# Patient Record
Sex: Male | Born: 1980 | Race: Black or African American | Hispanic: No | Marital: Single | Smoking: Current some day smoker
Health system: Southern US, Community
[De-identification: ages and names within clinical notes are randomized; demographics above are authoritative.]

## PROBLEM LIST (undated history)

## (undated) DIAGNOSIS — F419 Anxiety disorder, unspecified: Secondary | ICD-10-CM

## (undated) DIAGNOSIS — M542 Cervicalgia: Secondary | ICD-10-CM

## (undated) DIAGNOSIS — I10 Essential (primary) hypertension: Secondary | ICD-10-CM

## (undated) DIAGNOSIS — M545 Low back pain, unspecified: Secondary | ICD-10-CM

## (undated) DIAGNOSIS — F4310 Post-traumatic stress disorder, unspecified: Secondary | ICD-10-CM

## (undated) DIAGNOSIS — IMO0002 Reserved for concepts with insufficient information to code with codable children: Secondary | ICD-10-CM

## (undated) HISTORY — PX: FOOT SURGERY: SHX648

---

## 2008-09-11 ENCOUNTER — Emergency Department (HOSPITAL_COMMUNITY): Admission: EM | Admit: 2008-09-11 | Discharge: 2008-09-11 | Payer: Self-pay | Admitting: Emergency Medicine

## 2010-04-07 ENCOUNTER — Emergency Department (HOSPITAL_COMMUNITY): Admission: EM | Admit: 2010-04-07 | Discharge: 2010-04-07 | Payer: Self-pay | Admitting: Emergency Medicine

## 2010-04-15 ENCOUNTER — Emergency Department (HOSPITAL_COMMUNITY): Admission: EM | Admit: 2010-04-15 | Discharge: 2010-04-15 | Payer: Self-pay | Admitting: Emergency Medicine

## 2010-04-24 ENCOUNTER — Emergency Department (HOSPITAL_COMMUNITY): Admission: EM | Admit: 2010-04-24 | Discharge: 2010-04-24 | Payer: Self-pay | Admitting: Emergency Medicine

## 2010-04-25 ENCOUNTER — Emergency Department (HOSPITAL_COMMUNITY): Admission: EM | Admit: 2010-04-25 | Discharge: 2010-04-25 | Payer: Self-pay | Admitting: Emergency Medicine

## 2010-08-28 ENCOUNTER — Emergency Department (HOSPITAL_COMMUNITY): Admission: EM | Admit: 2010-08-28 | Discharge: 2010-08-28 | Payer: Self-pay | Admitting: Emergency Medicine

## 2010-09-01 ENCOUNTER — Ambulatory Visit (HOSPITAL_COMMUNITY): Payer: Self-pay | Admitting: Psychiatry

## 2010-09-09 ENCOUNTER — Ambulatory Visit (HOSPITAL_COMMUNITY): Payer: Self-pay | Admitting: Psychiatry

## 2010-09-11 ENCOUNTER — Emergency Department (HOSPITAL_COMMUNITY): Admission: EM | Admit: 2010-09-11 | Discharge: 2010-09-11 | Payer: Self-pay | Admitting: Emergency Medicine

## 2010-09-15 ENCOUNTER — Emergency Department (HOSPITAL_COMMUNITY): Admission: EM | Admit: 2010-09-15 | Discharge: 2010-09-15 | Payer: Self-pay | Admitting: Emergency Medicine

## 2010-09-25 ENCOUNTER — Ambulatory Visit (HOSPITAL_COMMUNITY): Payer: Self-pay | Admitting: Psychiatry

## 2010-09-30 ENCOUNTER — Emergency Department (HOSPITAL_COMMUNITY): Admission: EM | Admit: 2010-09-30 | Discharge: 2010-09-30 | Payer: Self-pay | Admitting: Emergency Medicine

## 2010-10-03 ENCOUNTER — Ambulatory Visit (HOSPITAL_COMMUNITY): Payer: Self-pay | Admitting: Psychiatry

## 2010-10-07 ENCOUNTER — Ambulatory Visit (HOSPITAL_COMMUNITY): Payer: Self-pay | Admitting: Psychiatry

## 2010-10-21 ENCOUNTER — Ambulatory Visit (HOSPITAL_COMMUNITY): Payer: Self-pay | Admitting: Psychiatry

## 2010-11-01 ENCOUNTER — Emergency Department (HOSPITAL_COMMUNITY)
Admission: EM | Admit: 2010-11-01 | Discharge: 2010-11-01 | Payer: Self-pay | Source: Home / Self Care | Admitting: Emergency Medicine

## 2010-11-07 ENCOUNTER — Ambulatory Visit (HOSPITAL_COMMUNITY): Payer: Self-pay | Admitting: Psychiatry

## 2010-11-10 ENCOUNTER — Ambulatory Visit (HOSPITAL_COMMUNITY): Payer: Self-pay | Admitting: Psychiatry

## 2010-12-15 ENCOUNTER — Encounter
Admission: RE | Admit: 2010-12-15 | Discharge: 2010-12-30 | Payer: Self-pay | Source: Home / Self Care | Attending: Podiatry | Admitting: Podiatry

## 2010-12-16 ENCOUNTER — Ambulatory Visit (HOSPITAL_COMMUNITY): Admit: 2010-12-16 | Payer: Self-pay | Admitting: Psychiatry

## 2010-12-16 IMAGING — CR DG CHEST 2V
2 series · 2 of 2 positions shown · non-contrast
Comparison: None.

CLINICAL DATA: Short of breath/bronchitis

CHEST - 2 VIEW

[w chest pa]
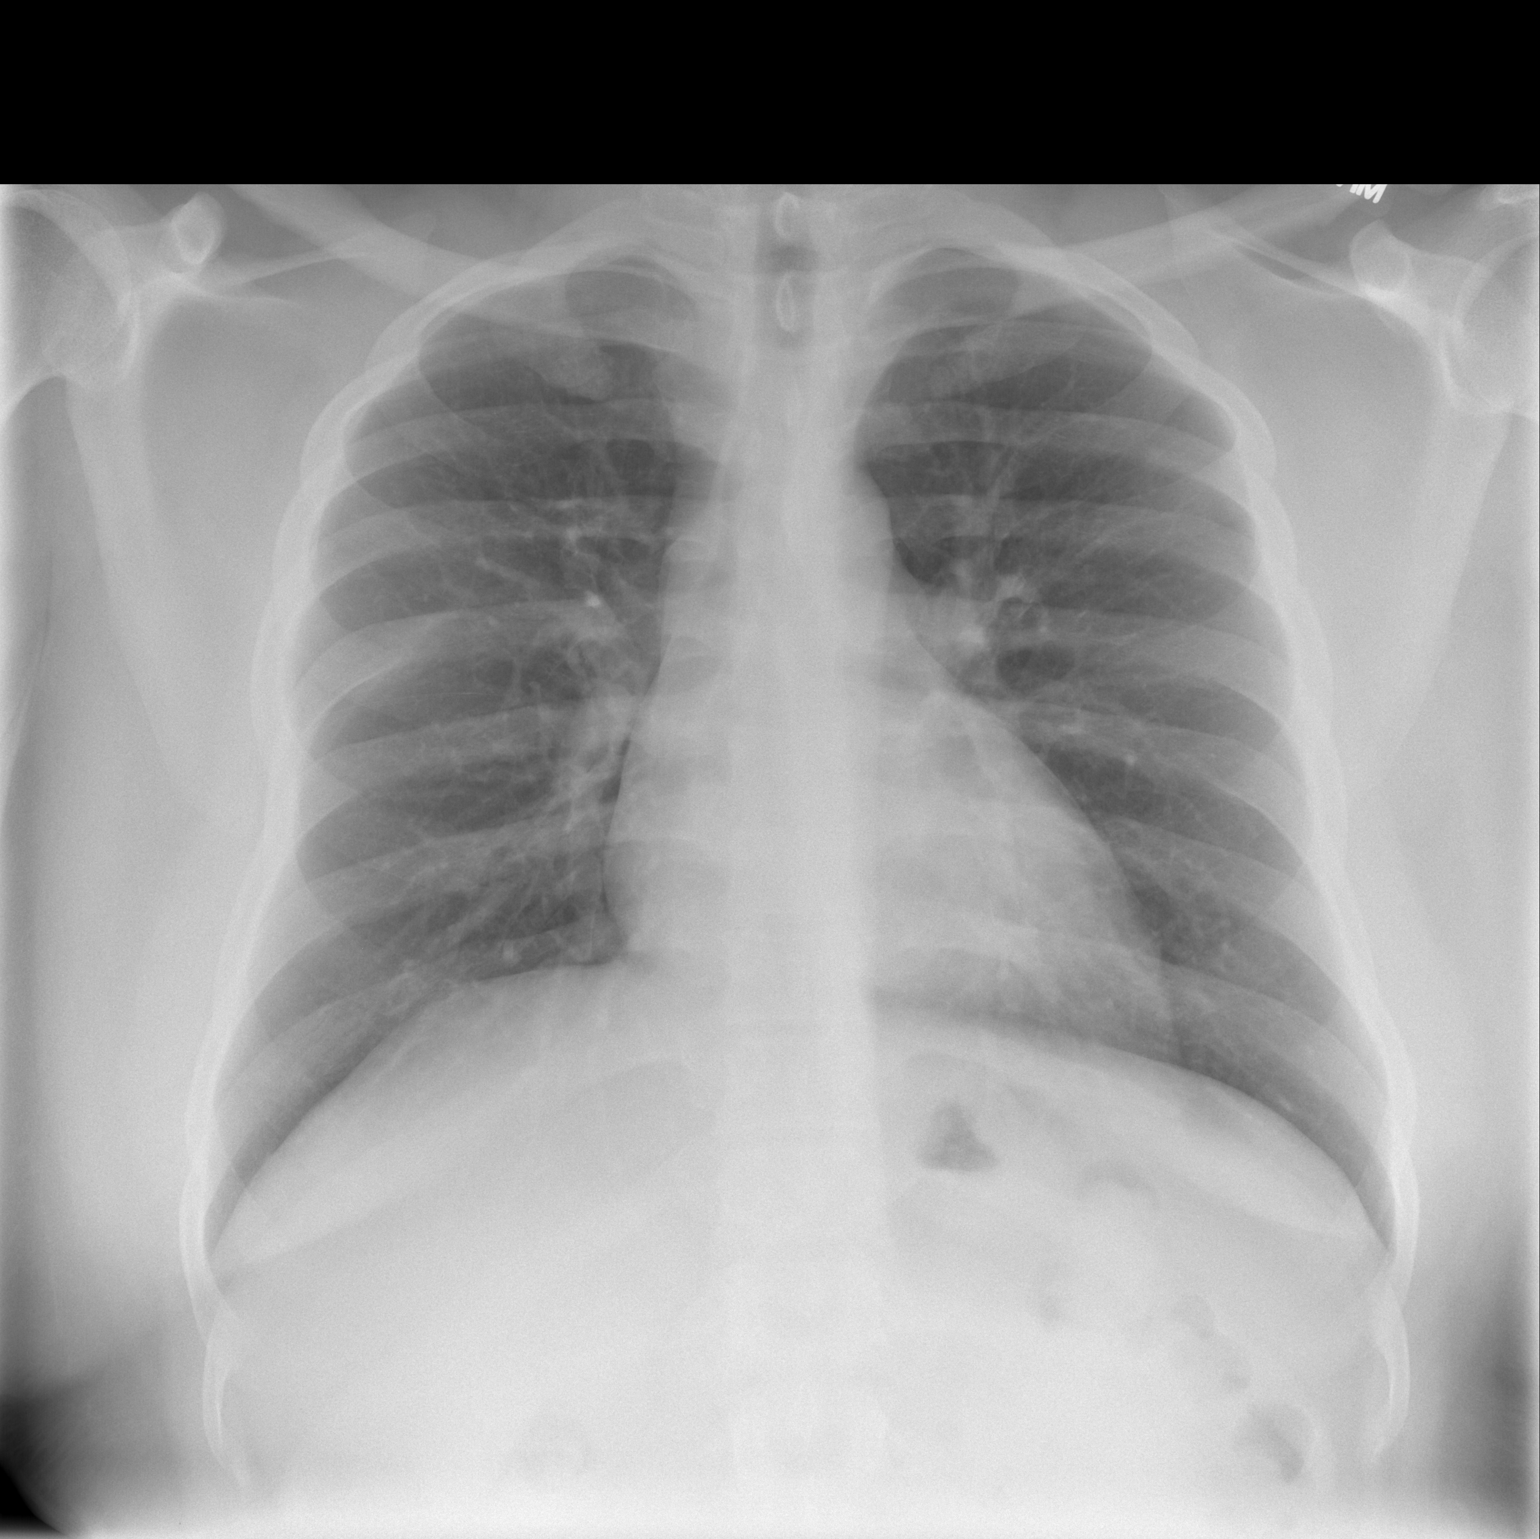

[w chest lat]
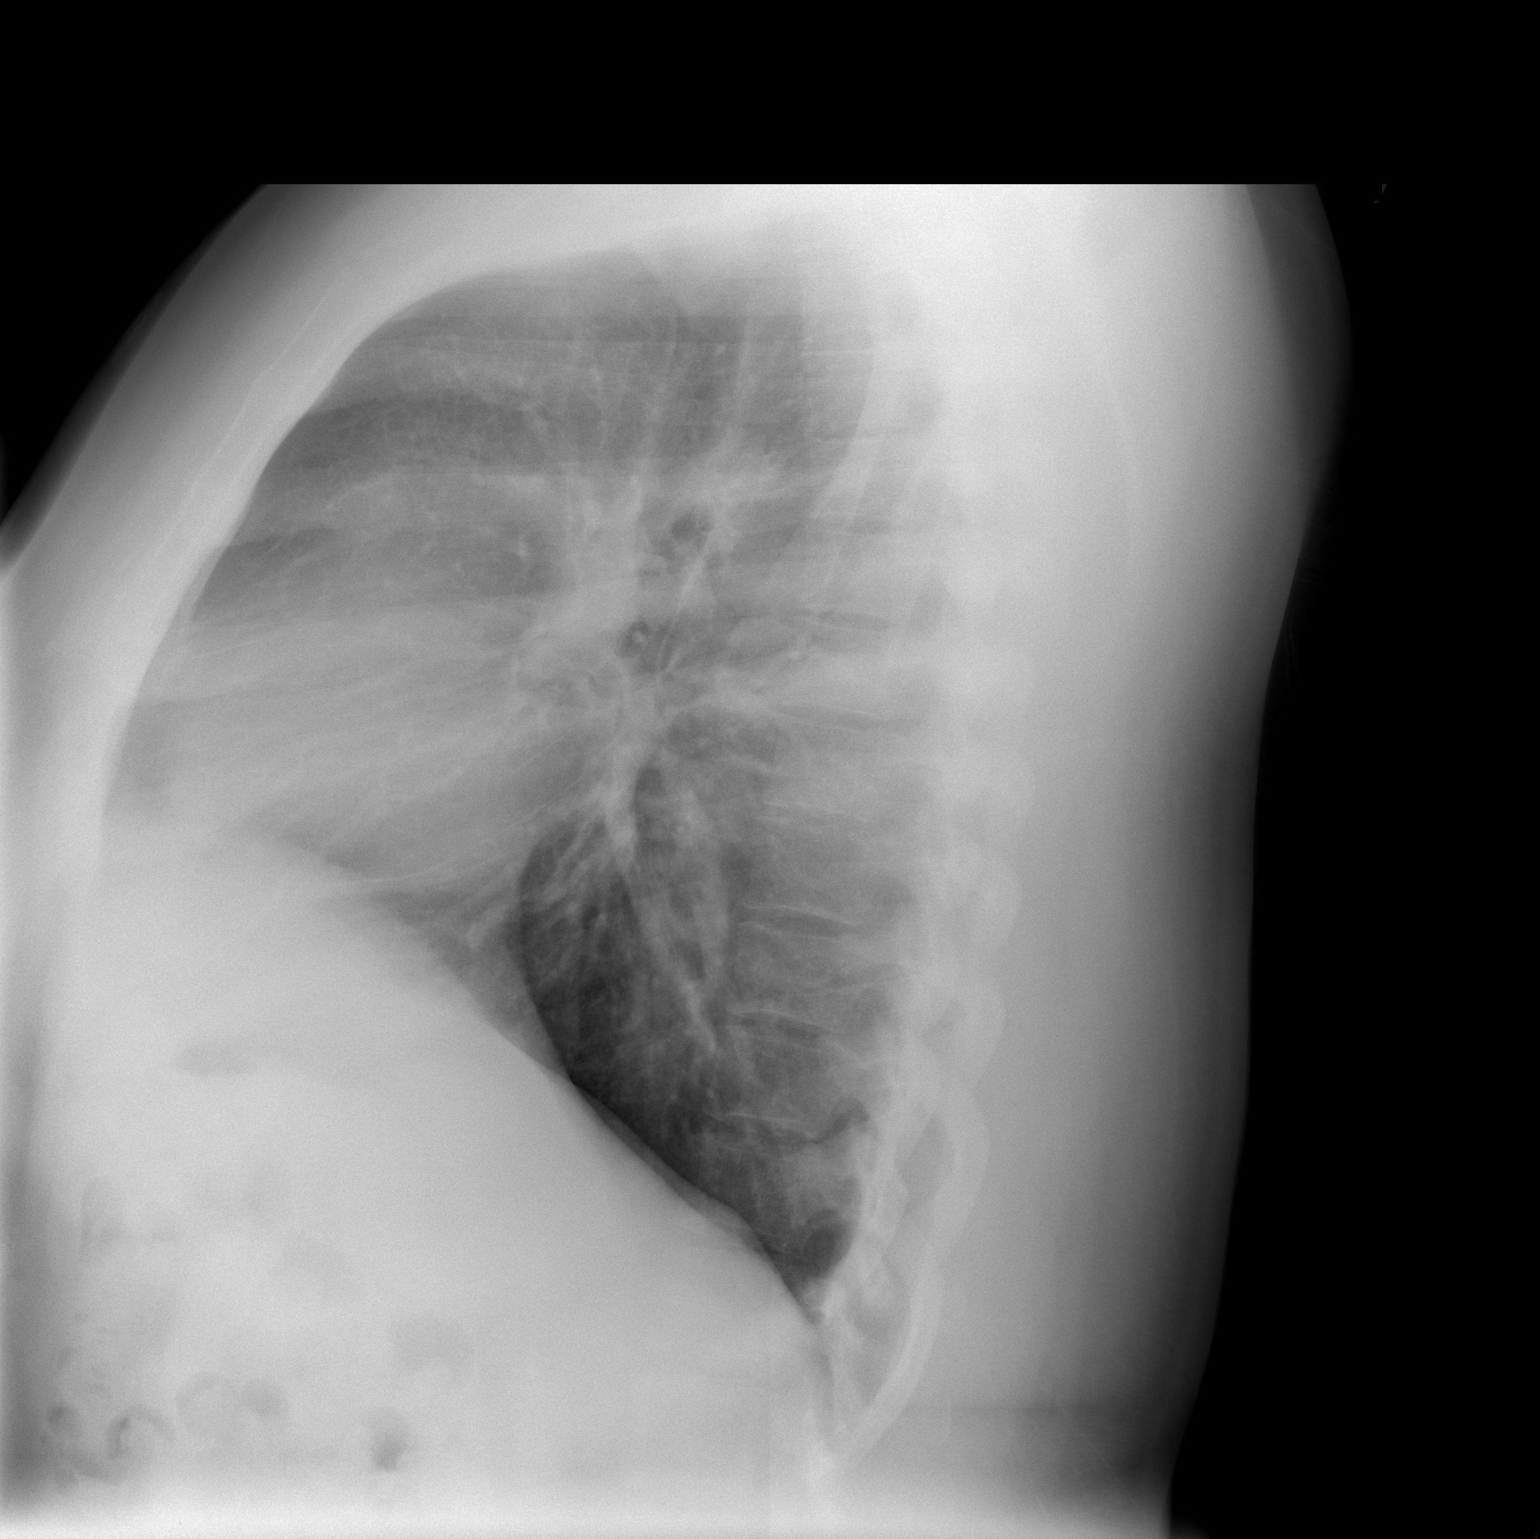

[2 of 2 positions shown; findings below may reference images not displayed]

FINDINGS: Heart and mediastinal contours normal.  Lungs clear.  No
pleural fluid.  Osseous structures and soft tissues unremarkable.
IMPRESSION: No acute or significant findings.

## 2010-12-23 ENCOUNTER — Ambulatory Visit (HOSPITAL_COMMUNITY)
Admission: RE | Admit: 2010-12-23 | Discharge: 2010-12-23 | Payer: Self-pay | Source: Home / Self Care | Attending: Psychiatry | Admitting: Psychiatry

## 2010-12-28 ENCOUNTER — Emergency Department (HOSPITAL_COMMUNITY)
Admission: EM | Admit: 2010-12-28 | Discharge: 2010-12-28 | Payer: Self-pay | Source: Home / Self Care | Admitting: Emergency Medicine

## 2010-12-30 ENCOUNTER — Ambulatory Visit (HOSPITAL_COMMUNITY): Admit: 2010-12-30 | Payer: Self-pay | Admitting: Psychiatry

## 2010-12-31 ENCOUNTER — Ambulatory Visit: Payer: Self-pay | Admitting: Physical Therapy

## 2011-01-02 ENCOUNTER — Encounter: Payer: Self-pay | Admitting: Physical Therapy

## 2011-02-16 LAB — URINALYSIS, ROUTINE W REFLEX MICROSCOPIC
Glucose, UA: NEGATIVE mg/dL
Leukocytes, UA: NEGATIVE
Nitrite: NEGATIVE
Protein, ur: 30 mg/dL — AB
pH: 6.5 (ref 5.0–8.0)

## 2011-02-16 LAB — BASIC METABOLIC PANEL
BUN: 10 mg/dL (ref 6–23)
CO2: 23 mEq/L (ref 19–32)
Calcium: 8.1 mg/dL — ABNORMAL LOW (ref 8.4–10.5)
Creatinine, Ser: 1.14 mg/dL (ref 0.4–1.5)
GFR calc Af Amer: >60 mL/min (ref >60)

## 2011-02-16 LAB — CBC
HCT: 45 % (ref 39.0–52.0)
Hemoglobin: 15 g/dL (ref 13.0–17.0)
MCHC: 33.4 g/dL (ref 30.0–36.0)
MCHC: 34 g/dL (ref 30.0–36.0)
MCV: 90.1 fL (ref 78.0–100.0)
MCV: 91.2 fL (ref 78.0–100.0)
Platelets: 254 10*3/uL (ref 150–400)
RBC: 4.47 MIL/uL (ref 4.22–5.81)
RDW: 12.2 % (ref 11.5–15.5)
RDW: 12.4 % (ref 11.5–15.5)

## 2011-02-16 LAB — DIFFERENTIAL
Basophils Absolute: 0 10*3/uL (ref 0.0–0.1)
Basophils Relative: 0 % (ref 0–1)
Basophils Relative: 0 % (ref 0–1)
Eosinophils Absolute: 0 10*3/uL (ref 0.0–0.7)
Lymphs Abs: 1.8 10*3/uL (ref 0.7–4.0)
Monocytes Relative: 10 % (ref 3–12)
Monocytes Relative: 15 % — ABNORMAL HIGH (ref 3–12)
Neutro Abs: 10.6 10*3/uL — ABNORMAL HIGH (ref 1.7–7.7)
Neutro Abs: 13.7 10*3/uL — ABNORMAL HIGH (ref 1.7–7.7)
Neutrophils Relative %: 75 % (ref 43–77)
Neutrophils Relative %: 80 % — ABNORMAL HIGH (ref 43–77)

## 2011-02-16 LAB — COMPREHENSIVE METABOLIC PANEL
BUN: 14 mg/dL (ref 6–23)
Calcium: 8.5 mg/dL (ref 8.4–10.5)
Creatinine, Ser: 1.25 mg/dL (ref 0.4–1.5)
Glucose, Bld: 123 mg/dL — ABNORMAL HIGH (ref 70–99)
Total Protein: 7.1 g/dL (ref 6.0–8.3)

## 2011-02-16 LAB — URINE MICROSCOPIC-ADD ON

## 2011-02-16 LAB — LIPASE, BLOOD: Lipase: 20 U/L (ref 11–59)

## 2011-09-07 ENCOUNTER — Emergency Department (HOSPITAL_COMMUNITY)
Admission: EM | Admit: 2011-09-07 | Discharge: 2011-09-07 | Disposition: A | Discharge status: Home / Self Care | Payer: Medicaid Other | Attending: Emergency Medicine | Admitting: Emergency Medicine

## 2011-09-07 DIAGNOSIS — I10 Essential (primary) hypertension: Secondary | ICD-10-CM | POA: Insufficient documentation

## 2011-09-07 DIAGNOSIS — M542 Cervicalgia: Secondary | ICD-10-CM | POA: Insufficient documentation

## 2011-09-07 DIAGNOSIS — M549 Dorsalgia, unspecified: Secondary | ICD-10-CM | POA: Insufficient documentation

## 2011-09-07 DIAGNOSIS — M79609 Pain in unspecified limb: Secondary | ICD-10-CM | POA: Insufficient documentation

## 2011-09-07 DIAGNOSIS — IMO0002 Reserved for concepts with insufficient information to code with codable children: Secondary | ICD-10-CM | POA: Insufficient documentation

## 2011-09-07 DIAGNOSIS — R209 Unspecified disturbances of skin sensation: Secondary | ICD-10-CM | POA: Insufficient documentation

## 2011-09-08 ENCOUNTER — Ambulatory Visit (HOSPITAL_BASED_OUTPATIENT_CLINIC_OR_DEPARTMENT_OTHER)
Admission: RE | Admit: 2011-09-08 | Payer: Medicaid Other | Source: Ambulatory Visit | Admitting: Oral and Maxillofacial Surgery

## 2011-09-25 ENCOUNTER — Emergency Department (HOSPITAL_COMMUNITY)
Admission: EM | Admit: 2011-09-25 | Discharge: 2011-09-25 | Disposition: A | Discharge status: Home / Self Care | Payer: Medicaid Other | Attending: Emergency Medicine | Admitting: Emergency Medicine

## 2011-09-25 DIAGNOSIS — M79609 Pain in unspecified limb: Secondary | ICD-10-CM | POA: Insufficient documentation

## 2011-09-25 DIAGNOSIS — I10 Essential (primary) hypertension: Secondary | ICD-10-CM | POA: Insufficient documentation

## 2011-09-25 LAB — URINALYSIS, ROUTINE W REFLEX MICROSCOPIC
Bilirubin Urine: NEGATIVE
Leukocytes, UA: NEGATIVE
Nitrite: NEGATIVE
Specific Gravity, Urine: 1.028 (ref 1.005–1.030)
Urobilinogen, UA: 0.2 mg/dL (ref 0.0–1.0)
pH: 6 (ref 5.0–8.0)

## 2011-09-25 LAB — POCT I-STAT, CHEM 8
Chloride: 108 mEq/L (ref 96–112)
HCT: 48 % (ref 39.0–52.0)
Hemoglobin: 16.3 g/dL (ref 13.0–17.0)
Potassium: 3.8 mEq/L (ref 3.5–5.1)
Sodium: 142 mEq/L (ref 135–145)

## 2011-11-18 ENCOUNTER — Encounter: Payer: Self-pay | Admitting: Emergency Medicine

## 2011-11-18 ENCOUNTER — Emergency Department (HOSPITAL_COMMUNITY)
Admission: EM | Admit: 2011-11-18 | Discharge: 2011-11-18 | Disposition: A | Discharge status: Home / Self Care | Payer: Medicare Other | Attending: Emergency Medicine | Admitting: Emergency Medicine

## 2011-11-18 DIAGNOSIS — G8929 Other chronic pain: Secondary | ICD-10-CM

## 2011-11-18 DIAGNOSIS — I10 Essential (primary) hypertension: Secondary | ICD-10-CM | POA: Insufficient documentation

## 2011-11-18 DIAGNOSIS — M542 Cervicalgia: Secondary | ICD-10-CM | POA: Insufficient documentation

## 2011-11-18 DIAGNOSIS — L089 Local infection of the skin and subcutaneous tissue, unspecified: Secondary | ICD-10-CM | POA: Insufficient documentation

## 2011-11-18 DIAGNOSIS — M549 Dorsalgia, unspecified: Secondary | ICD-10-CM | POA: Insufficient documentation

## 2011-11-18 HISTORY — DX: Essential (primary) hypertension: I10

## 2011-11-18 HISTORY — DX: Low back pain, unspecified: M54.50

## 2011-11-18 HISTORY — DX: Reserved for concepts with insufficient information to code with codable children: IMO0002

## 2011-11-18 HISTORY — DX: Cervicalgia: M54.2

## 2011-11-18 HISTORY — DX: Low back pain: M54.5

## 2011-11-18 HISTORY — DX: Post-traumatic stress disorder, unspecified: F43.10

## 2011-11-18 HISTORY — DX: Anxiety disorder, unspecified: F41.9

## 2011-11-18 MED ORDER — OXYCODONE-ACETAMINOPHEN 5-325 MG PO TABS: 1.0000 | ORAL_TABLET | Freq: Four times a day (QID) | ORAL | Status: AC | PRN

## 2011-11-18 NOTE — ED Notes (Signed)
Pt reports chronic pain all over his body, worse x several weeks; pcp prescribes percocet for pain and "I am running out of it. He only gives me 30 pills for the month but Im taking 8 a day. I go to pain management but I dont have another appointment with them until December 11, 2011 and they didn't give me a new script."

## 2011-11-18 NOTE — ED Provider Notes (Addendum)
History     CSN: [id number] Arrival date & time: 11/18/2011  1:53 PM   First MD Initiated Contact with Patient 11/18/11 1601      Chief Complaint  Patient presents with  . Pain    (Consider location/radiation/quality/duration/timing/severity/associated sxs/prior treatment) The history is provided by the patient.   patient is a 30 year old male [facility], and recently just started with chronic pain clinic. He has a history of chronic neck and low back pain currently out of Percocet pain management not to see him again to January 11 he has not tried to go back to his regular medical clinic. No new worse symptoms. Also with complaint of a bump to the left side of his lower lip at the crease, this is nonpainful patient was concerned to what is.  Past Medical History  Diagnosis Date  . Asthma   . Hypertension   . Anxiety   . PTSD (post-traumatic stress disorder)   . Cervicalgia   . Lumbago   . DDD (degenerative disc disease)     History reviewed. No pertinent past surgical history.  No family history on file.  History  Substance Use Topics  . Smoking status: Current Some Day Smoker -- 0.5 packs/day    Types: Cigarettes  . Smokeless tobacco: Not on file  . Alcohol Use: No      Review of Systems  Constitutional: Negative for fever and chills.  HENT: Positive for neck pain. Negative for congestion and sore throat.   Eyes: Negative for visual disturbance.  Respiratory: Negative for cough and shortness of breath.   Cardiovascular: Negative for chest pain.  Gastrointestinal: Negative for nausea, vomiting and abdominal pain.  Genitourinary: Negative for dysuria.  Musculoskeletal: Positive for back pain.  Neurological: Negative for headaches.  Hematological: Does not bruise/bleed easily.    Allergies  Vicodin  Home Medications   Current Outpatient Rx  Name Route Sig Dispense Refill  . ALBUTEROL SULFATE HFA 108 (90 BASE) MCG/ACT IN AERS  Inhalation Inhale 2 puffs into the lungs every 6 (six) hours as needed. Shortness of breath     . BENAZEPRIL-HYDROCHLOROTHIAZIDE 20-12.5 MG PO TABS Oral Take 1 tablet by mouth daily.      . BUDESONIDE-FORMOTEROL FUMARATE 160-4.5 MCG/ACT IN AERO Inhalation Inhale 2 puffs into the lungs 2 (two) times daily.      Marland Kitchen DIAZEPAM 10 MG PO TABS Oral Take 10 mg by mouth every 4 (four) hours as needed. For anxiety     . DULOXETINE HCL 60 MG PO CPEP Oral Take 60 mg by mouth daily.      Marland Kitchen GABAPENTIN 300 MG PO CAPS Oral Take 900 mg by mouth at bedtime.      . OXYCODONE-ACETAMINOPHEN 5-325 MG PO TABS Oral Take 1 tablet by mouth daily as needed. For pain     . OXYCODONE-ACETAMINOPHEN 5-325 MG PO TABS Oral Take 1-2 tablets by mouth every 6 (six) hours as needed for pain. 10 tablet 0    BP 135/84  Pulse 68  Temp(Src) 97.4 F (36.3 C) (Oral)  Resp 14  SpO2 100%  Physical Exam  Nursing note and vitals reviewed. Constitutional: He is oriented to person, place, and time. He appears well-developed and well-nourished.  HENT:  Head: Normocephalic and atraumatic.  Mouth/Throat: Oropharynx is clear and moist.       Except for left lower lip area degrees a 2 mm pustule with minimal induration no purulent drainage nonulcerated nontender. No cervical adenopathy  Eyes: Conjunctivae  and EOM are normal. Pupils are equal, round, and reactive to light.  Neck: Normal range of motion. Neck supple.  Cardiovascular: Normal rate, regular rhythm and normal heart sounds.   No murmur heard. Pulmonary/Chest: Effort normal and breath sounds normal.  Abdominal: Soft. Bowel sounds are normal. There is no tenderness.  Musculoskeletal: Normal range of motion.  Lymphadenopathy:    He has no cervical adenopathy.  Neurological: He is alert and oriented to person, place, and time. No cranial nerve deficit. He exhibits normal muscle tone. Coordination normal.  Skin: Skin is warm. No rash noted.    ED Course  Procedures (including  critical care time)  Labs Reviewed - No data to display No results found.   1. Chronic pain       MDM   Patient recently diagnosed with chronic pain followed by the [facility] and recently [facility]. Patient, pain areas neck and lower back states it is running out of his Percocet and he can not get more from chronic pain that is only seen in one time until January 11 has not tried to go back to [facility] to get a medicine no other complaints. Other than a pustule to the left side of his lip he does came up in the past day. This is consistent with like a skin cyst or folliculitis about 2 mm in size. No specific treatment needed for that. Not consistent with cold sores this has no significant pain.  Will give patient with a number of Percocet total of 10 tablets going to back to [facility].         Shelda Jakes, MD 11/18/11 1724  Shelda Jakes, MD 11/18/11 [phone]

## 2012-06-24 ENCOUNTER — Encounter (HOSPITAL_COMMUNITY): Payer: Self-pay | Admitting: Emergency Medicine

## 2012-06-24 ENCOUNTER — Emergency Department (HOSPITAL_COMMUNITY)
Admission: EM | Admit: 2012-06-24 | Discharge: 2012-06-24 | Disposition: A | Discharge status: Home / Self Care | Payer: Medicare Other | Attending: Emergency Medicine | Admitting: Emergency Medicine

## 2012-06-24 DIAGNOSIS — J45909 Unspecified asthma, uncomplicated: Secondary | ICD-10-CM | POA: Insufficient documentation

## 2012-06-24 DIAGNOSIS — F411 Generalized anxiety disorder: Secondary | ICD-10-CM | POA: Insufficient documentation

## 2012-06-24 DIAGNOSIS — IMO0002 Reserved for concepts with insufficient information to code with codable children: Secondary | ICD-10-CM | POA: Insufficient documentation

## 2012-06-24 DIAGNOSIS — I10 Essential (primary) hypertension: Secondary | ICD-10-CM | POA: Insufficient documentation

## 2012-06-24 DIAGNOSIS — L738 Other specified follicular disorders: Secondary | ICD-10-CM | POA: Insufficient documentation

## 2012-06-24 DIAGNOSIS — F17200 Nicotine dependence, unspecified, uncomplicated: Secondary | ICD-10-CM | POA: Insufficient documentation

## 2012-06-24 DIAGNOSIS — B354 Tinea corporis: Secondary | ICD-10-CM | POA: Insufficient documentation

## 2012-06-24 DIAGNOSIS — F4310 Post-traumatic stress disorder, unspecified: Secondary | ICD-10-CM | POA: Insufficient documentation

## 2012-06-24 DIAGNOSIS — L678 Other hair color and hair shaft abnormalities: Secondary | ICD-10-CM | POA: Insufficient documentation

## 2012-06-24 MED ORDER — CLOTRIMAZOLE 1 % EX CREA: TOPICAL_CREAM | Freq: Two times a day (BID) | CUTANEOUS | Status: DC

## 2012-06-24 MED ORDER — HYDROXYZINE HCL 10 MG PO TABS
10.0000 mg | ORAL_TABLET | Freq: Once | ORAL | Status: AC
  Administered 2012-06-24: 10 mg via ORAL
  Filled 2012-06-24: qty 1

## 2012-06-24 MED ORDER — HYDROXYZINE HCL 10 MG PO TABS: 10.0000 mg | ORAL_TABLET | Freq: Four times a day (QID) | ORAL | Status: AC | PRN

## 2012-06-24 MED ORDER — MUPIROCIN 2 % EX OINT: TOPICAL_OINTMENT | Freq: Three times a day (TID) | CUTANEOUS | Status: AC

## 2012-06-24 NOTE — ED Provider Notes (Signed)
I saw and evaluated the patient, reviewed the resident's note and I agree with the findings and plan.  Rash is consistent with a folliculitis.  Home with topical medicine.  Lyanne Co, MD 06/24/12 [phone]

## 2012-06-24 NOTE — ED Notes (Signed)
Pt c/o bumps on back of head that come and go for some time now and wring worm to left lower leg area x 2 weeks.

## 2012-06-24 NOTE — ED Provider Notes (Signed)
History     CSN: [id number]  Arrival date & time 06/24/12  0730   None     Chief Complaint  Patient presents with  . Rash    (Consider location/radiation/quality/duration/timing/severity/associated sxs/prior treatment) HPI  31 year old presenting with 2 rashes. The most concerning rash is located on his neck, around his hairline. He describes as recurrent bumps that arise after getting his haircut. They itch, and he scratches them to the point where they bleed. His consult with PCP regarding issue and was prescribed a topical cream and by mouth antibiotics. The patient states that these were effective for a short time But the rash rash continues to recur. He does not recall the name of the cream or the antibiotics. He also states that he shaves his face for the razor, and he'll develop the same type of lesions. The second rash is concerned about as small annular lesion on his left shin. It is been there for several weeks. It itches, but does not appear like the bumps on his neck. He has not had any treatment of this before.  Past Medical History  Diagnosis Date  . Asthma   . Hypertension   . Anxiety   . PTSD (post-traumatic stress disorder)   . Cervicalgia   . Lumbago   . DDD (degenerative disc disease)     Past Surgical History  Procedure Date  . Foot surgery Left    History reviewed. No pertinent family history.  History  Substance Use Topics  . Smoking status: Current Some Day Smoker -- 0.5 packs/day    Types: Cigarettes  . Smokeless tobacco: Not on file  . Alcohol Use: No      Review of Systems  All other systems reviewed and are negative.    Allergies  Vicodin  Home Medications   Current Outpatient Rx  Name Route Sig Dispense Refill  . ALBUTEROL SULFATE HFA 108 (90 BASE) MCG/ACT IN AERS Inhalation Inhale 2 puffs into the lungs every 6 (six) hours as needed. Shortness of breath     . BENAZEPRIL-HYDROCHLOROTHIAZIDE 20-12.5 MG PO TABS Oral Take 1 tablet  by mouth daily.      . BUDESONIDE-FORMOTEROL FUMARATE 160-4.5 MCG/ACT IN AERO Inhalation Inhale 2 puffs into the lungs 2 (two) times daily.      Marland Kitchen DIAZEPAM 10 MG PO TABS Oral Take 10 mg by mouth every 4 (four) hours as needed. For anxiety     . GABAPENTIN 300 MG PO CAPS Oral Take 900 mg by mouth at bedtime.      . OXYCODONE-ACETAMINOPHEN 5-325 MG PO TABS Oral Take 1 tablet by mouth daily as needed. For pain     . CLOTRIMAZOLE 1 % EX CREA Topical Apply topically 2 (two) times daily. Apply to your leg. 30 g 0  . HYDROXYZINE HCL 10 MG PO TABS Oral Take 1 tablet (10 mg total) by mouth every 6 (six) hours as needed for itching. 30 tablet 0  . MUPIROCIN 2 % EX OINT Topical Apply topically 3 (three) times daily. Apply to your neck for 3 days. 22 g 0    BP 154/92  Pulse 81  Temp 97.4 F (36.3 C) (Oral)  Resp 18  SpO2 99%  Physical Exam  Constitutional: He is oriented to person, place, and time. He appears well-developed and well-nourished.  HENT:  Head: Normocephalic and atraumatic.  Eyes: Conjunctivae are normal. Pupils are equal, round, and reactive to light.  Neck: Normal range of motion.  Cardiovascular: Normal  rate and regular rhythm.   Pulmonary/Chest: Effort normal and breath sounds normal.  Abdominal: Soft. There is no tenderness.  Musculoskeletal: Normal range of motion.  Neurological: He is alert and oriented to person, place, and time.  Skin: Rash noted. No abrasion noted.       ED Course  Procedures (including critical care time)  Labs Reviewed - No data to display No results found.   1. Folliculitis barbae   2. Tinea corporis       MDM  31 year old male who presents with uncomplicated folliculitis and tinea corporis. He was prescribed clotrimazole for his tinea and mupirocin for the folliculitis. He was stable and appropriate for discharge.He was agreeable with this plan.        Garnetta Buddy, MD 06/24/12 [phone]

## 2012-07-13 ENCOUNTER — Emergency Department (HOSPITAL_COMMUNITY): Payer: Medicare Other

## 2012-07-13 ENCOUNTER — Encounter (HOSPITAL_COMMUNITY): Payer: Self-pay | Admitting: Emergency Medicine

## 2012-07-13 ENCOUNTER — Emergency Department (HOSPITAL_COMMUNITY)
Admission: EM | Admit: 2012-07-13 | Discharge: 2012-07-13 | Disposition: A | Discharge status: Home / Self Care | Payer: Medicare Other | Attending: Emergency Medicine | Admitting: Emergency Medicine

## 2012-07-13 DIAGNOSIS — M549 Dorsalgia, unspecified: Secondary | ICD-10-CM

## 2012-07-13 DIAGNOSIS — IMO0002 Reserved for concepts with insufficient information to code with codable children: Secondary | ICD-10-CM | POA: Insufficient documentation

## 2012-07-13 DIAGNOSIS — J45909 Unspecified asthma, uncomplicated: Secondary | ICD-10-CM | POA: Insufficient documentation

## 2012-07-13 DIAGNOSIS — I10 Essential (primary) hypertension: Secondary | ICD-10-CM | POA: Insufficient documentation

## 2012-07-13 DIAGNOSIS — G8929 Other chronic pain: Secondary | ICD-10-CM | POA: Insufficient documentation

## 2012-07-13 DIAGNOSIS — F17200 Nicotine dependence, unspecified, uncomplicated: Secondary | ICD-10-CM | POA: Insufficient documentation

## 2012-07-13 DIAGNOSIS — F4310 Post-traumatic stress disorder, unspecified: Secondary | ICD-10-CM | POA: Insufficient documentation

## 2012-07-13 MED ORDER — OXYCODONE-ACETAMINOPHEN 5-325 MG PO TABS
1.0000 | ORAL_TABLET | Freq: Once | ORAL | Status: AC
  Administered 2012-07-13: 1 via ORAL
  Filled 2012-07-13: qty 1

## 2012-07-13 MED ORDER — OXYCODONE-ACETAMINOPHEN 5-325 MG PO TABS: 1.0000 | ORAL_TABLET | Freq: Four times a day (QID) | ORAL | Status: AC | PRN

## 2012-07-13 NOTE — ED Provider Notes (Signed)
Medical screening examination/treatment/procedure(s) were performed by non-physician practitioner and as supervising physician I was immediately available for consultation/collaboration.  Charika Mikelson R. Daphyne Miguez, MD 07/13/12 1552 

## 2012-07-13 NOTE — ED Provider Notes (Signed)
History     CSN: [id number]  Arrival date & time 07/13/12  4098   First MD Initiated Contact with Patient 07/13/12 1035      Chief Complaint  Patient presents with  . Back Pain  . Neck Pain    (Consider location/radiation/quality/duration/timing/severity/associated sxs/prior treatment) HPI  Patient to the ER with complaints of acute on chronic back pain. He denies injury that has worsened the pain. He denies this pain being different in quality and location than his normal back pain but says it is worse than normal. He has had episodes like this in the past. He has not gone to his pain clinic for refills. He denies loss of bowel or urine control. He denies numbness, tingling, or weakness in any of his extremities. The pain pattern is cervical paraspinal muscles than radiates out to bilateral shoulder. He denies IV drug usage, he denies fevers, chills, vomiting, rash.  Past Medical History  Diagnosis Date  . Asthma   . Hypertension   . Anxiety   . PTSD (post-traumatic stress disorder)   . Cervicalgia   . Lumbago   . DDD (degenerative disc disease)     Past Surgical History  Procedure Date  . Foot surgery Left    No family history on file.  History  Substance Use Topics  . Smoking status: Current Some Day Smoker -- 0.5 packs/day    Types: Cigarettes  . Smokeless tobacco: Not on file  . Alcohol Use: No      Review of Systems   HEENT: denies blurry vision or change in hearing PULMONARY: Denies difficulty breathing and SOB CARDIAC: denies chest pain or heart palpitations MUSCULOSKELETAL:  denies being unable to ambulate ABDOMEN AL: denies abdominal pain GU: denies loss of bowel or urinary control NEURO: denies numbness and tingling in extremities SKIN: no new rashes PSYCH: patient denies anxiety or depression. NECK: Pt denies having neck pain     Allergies  Vicodin  Home Medications   Current Outpatient Rx  Name Route Sig Dispense Refill  . ALBUTEROL  SULFATE HFA 108 (90 BASE) MCG/ACT IN AERS Inhalation Inhale 2 puffs into the lungs every 6 (six) hours as needed. Shortness of breath     . BENAZEPRIL-HYDROCHLOROTHIAZIDE 20-12.5 MG PO TABS Oral Take 1 tablet by mouth daily.      . BUDESONIDE-FORMOTEROL FUMARATE 160-4.5 MCG/ACT IN AERO Inhalation Inhale 2 puffs into the lungs 2 (two) times daily.      . OXYCODONE-ACETAMINOPHEN 5-325 MG PO TABS Oral Take 1 tablet by mouth every 6 (six) hours as needed for pain. 5 tablet 0    BP 132/82  Pulse 70  Temp 98.5 F (36.9 C) (Oral)  Resp 20  SpO2 100%  Physical Exam  Nursing note and vitals reviewed. Constitutional: He appears well-developed and well-nourished. No distress.  HENT:  Head: Normocephalic and atraumatic.  Eyes: Pupils are equal, round, and reactive to light.  Neck: Normal range of motion. Neck supple.  Cardiovascular: Normal rate and regular rhythm.   Pulmonary/Chest: Effort normal.  Abdominal: Soft.  Musculoskeletal:       Back:        Equal strength to bilateral lower extremities. Neurosensory  function adequate to both legs. Skin color is normal. Skin is warm and moist. I see no step off deformity, no bony tenderness. Pt is able to ambulate without limp. Pain is relieved when sitting in certain positions. ROM is decreased due to pain. No crepitus, laceration, effusion, swelling.  Pulses are normal  Neurological: He is alert.  Skin: Skin is warm and dry.    ED Course  Procedures (including critical care time)  Labs Reviewed - No data to display No results found.   1. Chronic back pain       MDM  Patient has Chronic back pains and denies this episode being different in quality than normal ut is worse in severity.   He says that the [facility] will start taking walk-ins at 11 and he knows he will have to sit there for hours before being seen, if he gets seen, and requests a small amount of pain medication.   Pt given a Rx for 5 tabs a 5-235 percocets  and informed that he needs to go to [facility] pain clinic for his refills.  Patient with back pain. No neurological deficits. Patient is ambulatory. No warning symptoms of back pain including: loss of bowel or bladder control, night sweats, waking from sleep with back pain, unexplained fevers or weight loss, h/o cancer, IVDU, recent trauma. No concern for cauda equina, epidural abscess, or other serious cause of back pain. Conservative measures such as rest, ice/heat and pain medicine indicated with PCP follow-up if no improvement with conservative management.           Dorthula Matas, PA 07/13/12 1104
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
## 2012-07-13 NOTE — ED Notes (Signed)
Pt presenting to ed with c/o back pain and bilateral neck pain. Pt states he has history of chronic pain but has not been able to see a chronic pain specialist. Pt states onset yesterday

## 2014-08-07 ENCOUNTER — Emergency Department (INDEPENDENT_AMBULATORY_CARE_PROVIDER_SITE_OTHER)
Admission: EM | Admit: 2014-08-07 | Discharge: 2014-08-07 | Disposition: A | Discharge status: Home / Self Care | Payer: Medicare Other | Source: Home / Self Care

## 2014-08-07 ENCOUNTER — Encounter (HOSPITAL_COMMUNITY): Payer: Self-pay | Admitting: Emergency Medicine

## 2014-08-07 DIAGNOSIS — G894 Chronic pain syndrome: Secondary | ICD-10-CM

## 2014-08-07 NOTE — ED Notes (Signed)
Pt  States  He  Is  From  Out of  [place]  And  He  Is  out of  His  meds       He     States  He   Has  Chronic  Pain        And  Is  Out  Of   His  meds     He  States   He  Is  Not  Going  Home     Until  Next  Month

## 2014-08-07 NOTE — ED Provider Notes (Signed)
CSN: [id number]     Arrival date & time 08/07/14  1136 History   None    Chief Complaint  Patient presents with  . Medication Refill   (Consider location/radiation/quality/duration/timing/severity/associated sxs/prior Treatment) Patient is a 33 y.o. male presenting with general illness. The history is provided by the patient.  Illness Location:  Pt with chronic pain issues,from [place], requesting refills.,advised that these meds needed to be refilled by his reg md and to be referred to md in this area as needed.   Past Medical History  Diagnosis Date  . Asthma   . Hypertension   . Anxiety   . PTSD (post-traumatic stress disorder)   . Cervicalgia   . Lumbago   . DDD (degenerative disc disease)    Past Surgical History  Procedure Laterality Date  . Foot surgery  Left   History reviewed. No pertinent family history. History  Substance Use Topics  . Smoking status: Current Some Day Smoker -- 0.50 packs/day    Types: Cigarettes  . Smokeless tobacco: Not on file  . Alcohol Use: No    Review of Systems  Constitutional: Negative.     Allergies  Vicodin  Home Medications   Prior to Admission medications   Medication Sig Start Date End Date Taking? Authorizing Provider  ALPRAZolam (XANAX PO) Take by mouth.   Yes Historical Provider, MD  Amphetamine-Dextroamphetamine (ADDERALL PO) Take by mouth.   Yes Historical Provider, MD  OXYCODONE HCL PO Take by mouth.   Yes Historical Provider, MD  albuterol (PROVENTIL HFA;VENTOLIN HFA) 108 (90 BASE) MCG/ACT inhaler Inhale 2 puffs into the lungs every 6 (six) hours as needed. Shortness of breath     Historical Provider, MD  benazepril-hydrochlorthiazide (LOTENSIN HCT) 20-12.5 MG per tablet Take 1 tablet by mouth daily.      Historical Provider, MD  budesonide-formoterol (SYMBICORT) 160-4.5 MCG/ACT inhaler Inhale 2 puffs into the lungs 2 (two) times daily.      Historical Provider, MD  hydrochlorothiazide (HYDRODIURIL) 25 MG tablet  Take 25 mg by mouth daily.    Historical Provider, MD   BP 160/106  Pulse 72  Temp(Src) 98.3 F (36.8 C) (Oral)  Resp 16  SpO2 97% Physical Exam  Nursing note and vitals reviewed. Constitutional: He is oriented to person, place, and time. He appears well-developed and well-nourished.  Neurological: He is alert and oriented to person, place, and time.    ED Course  Procedures (including critical care time) Labs Review Labs Reviewed - No data to display  Imaging Review No results found.   MDM   1. Chronic pain disorder        Linna Hoff, MD 08/07/14 1319

## 2014-08-07 NOTE — Discharge Instructions (Signed)
Contact your physicians for refill of medications  And for referral to pain management.
